# Patient Record
Sex: Male | Born: 1990 | Race: White | Hispanic: No | Smoking: Never smoker
Health system: Southern US, Community
[De-identification: ages and names within clinical notes are randomized; demographics above are authoritative.]

---
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
## 2010-06-20 NOTE — H&P (Signed)
NAME:  Logan Reeves, Logan Reeves NO.:  [id number]   MEDICAL RECORD NO.:  [id number]                  PATIENT TYPE:   LOCATION:                                       FACILITY:   PHYSICIAN:  Vickki Hearing, M.D.           DATE OF BIRTH:  1990/09/18   DATE OF ADMISSION:  DATE OF DISCHARGE:                                HISTORY & PHYSICAL   CHIEF COMPLAINT:  Mass in the right long finger.   HISTORY:  He is 20 years old.  He has noticed a knot in the right long  finger for a couple of months now.  It appears to be getting bigger.  He  wishes to have it removed because of pain.   PAST MEDICAL HISTORY:  He has a history of headaches and seasonal allergies.  He has had an adenoidectomy and tubes placed in his ears at age 42.  He takes  Clarinex.   FAMILY HISTORY:  He has a family history of heart disease and arthritis.  His family is followed by the [facility].   ALLERGIES:  He has no medical allergies.   PHYSICAL EXAMINATION:  VITAL SIGNS:  Weight 107, pulse 80, respiratory rate  20.  GENERAL:  He is well-developed, well-nourished.  Grooming and hygiene are  normal.  He has normal pulses and perfusion. No obvious lymph nodes.  EXTREMITIES:  He has a mass in the palmar aspect of the right long finger at  the MP joint crease.  It does not affect range of motion, but it is tender.  His finger is stable.  Strength is normal.  SKIN:  Intact.  He does have acne.  NEUROLOGIC:  Exam was normal.  He was awake and alert.  No depression or  anxiety.   IMPRESSION:  Ganglion cyst.  Recommend excision, and follow up with me in  the office after outpatient surgery.     ___________________________________________                                         Vickki Hearing, M.D.   SEH/MEDQ  D:  08/20/2003  T:  08/20/2003  Job:  [id number]
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
## 2010-07-29 ENCOUNTER — Emergency Department (HOSPITAL_COMMUNITY)
Admission: EM | Admit: 2010-07-29 | Discharge: 2010-07-29 | Disposition: A | Discharge status: Home / Self Care | Payer: Worker's Compensation | Attending: Emergency Medicine | Admitting: Emergency Medicine

## 2010-07-29 DIAGNOSIS — S51809A Unspecified open wound of unspecified forearm, initial encounter: Secondary | ICD-10-CM | POA: Insufficient documentation

## 2010-07-29 DIAGNOSIS — W260XXA Contact with knife, initial encounter: Secondary | ICD-10-CM | POA: Insufficient documentation

## 2010-07-29 DIAGNOSIS — W261XXA Contact with sword or dagger, initial encounter: Secondary | ICD-10-CM | POA: Insufficient documentation

## 2010-10-31 LAB — POCT HEMOGLOBIN-HEMACUE: Hemoglobin: 15.8

## 2013-10-29 ENCOUNTER — Emergency Department (HOSPITAL_COMMUNITY)
Admission: EM | Admit: 2013-10-29 | Discharge: 2013-10-29 | Disposition: A | Discharge status: Home / Self Care | Payer: BC Managed Care – PPO | Attending: Emergency Medicine | Admitting: Emergency Medicine

## 2013-10-29 ENCOUNTER — Emergency Department (HOSPITAL_COMMUNITY): Payer: BC Managed Care – PPO

## 2013-10-29 ENCOUNTER — Encounter (HOSPITAL_COMMUNITY): Payer: Self-pay | Admitting: Emergency Medicine

## 2013-10-29 DIAGNOSIS — H6120 Impacted cerumen, unspecified ear: Secondary | ICD-10-CM | POA: Diagnosis not present

## 2013-10-29 DIAGNOSIS — R6883 Chills (without fever): Secondary | ICD-10-CM | POA: Insufficient documentation

## 2013-10-29 DIAGNOSIS — Z79899 Other long term (current) drug therapy: Secondary | ICD-10-CM | POA: Diagnosis not present

## 2013-10-29 DIAGNOSIS — J029 Acute pharyngitis, unspecified: Secondary | ICD-10-CM | POA: Diagnosis not present

## 2013-10-29 DIAGNOSIS — R6889 Other general symptoms and signs: Secondary | ICD-10-CM

## 2013-10-29 LAB — RAPID STREP SCREEN (MED CTR MEBANE ONLY): STREPTOCOCCUS, GROUP A SCREEN (DIRECT): NEGATIVE

## 2013-10-29 MED ORDER — ACETAMINOPHEN 500 MG PO TABS: 500.0000 mg | ORAL_TABLET | Freq: Four times a day (QID) | ORAL | Status: AC | PRN

## 2013-10-29 MED ORDER — IBUPROFEN 400 MG PO TABS
800.0000 mg | ORAL_TABLET | Freq: Once | ORAL | Status: AC
  Administered 2013-10-29: 800 mg via ORAL
  Filled 2013-10-29: qty 2

## 2013-10-29 MED ORDER — IBUPROFEN 800 MG PO TABS: 800.0000 mg | ORAL_TABLET | Freq: Three times a day (TID) | ORAL | Status: AC

## 2013-10-29 MED ORDER — ACETAMINOPHEN 325 MG PO TABS
325.0000 mg | ORAL_TABLET | Freq: Once | ORAL | Status: AC
  Administered 2013-10-29: 325 mg via ORAL
  Filled 2013-10-29: qty 1

## 2013-10-29 MED ORDER — ALBUTEROL SULFATE HFA 108 (90 BASE) MCG/ACT IN AERS: 2.0000 | INHALATION_SPRAY | Freq: Four times a day (QID) | RESPIRATORY_TRACT | Status: AC | PRN

## 2013-10-29 MED ORDER — HYDROCODONE-HOMATROPINE 5-1.5 MG/5ML PO SYRP: 5.0000 mL | ORAL_SOLUTION | Freq: Four times a day (QID) | ORAL | Status: AC | PRN

## 2013-10-29 MED ORDER — GUAIFENESIN 100 MG/5ML PO SOLN
5.0000 mL | Freq: Once | ORAL | Status: AC
  Administered 2013-10-29: 100 mg via ORAL
  Filled 2013-10-29: qty 5

## 2013-10-29 NOTE — ED Provider Notes (Signed)
Medical screening examination/treatment/procedure(s) were performed by non-physician practitioner and as supervising physician I was immediately available for consultation/collaboration.   EKG Interpretation None        Courtney F Horton, MD 10/29/13 1943 

## 2013-10-29 NOTE — ED Provider Notes (Signed)
CSN: [id number]     Arrival date & time 10/29/13  1013 History  This chart was scribed for Francee Piccolo, PA-C working with Shon Baton, MD by Freida Busman, ED Scribe. This patient was seen in room TR09C/TR09C and the patient's care was started at 10:37 AM.    Chief Complaint  Patient presents with  . Sore Throat  . Chills     The history is provided by the patient. No language interpreter was used.    HPI Comments:  RORY XIANG is a 23 y.o. male who presents to the Emergency Department complaining of mild-moderate sore throat and  productive cough with dark "brown sputum" that started 4-5 days ago. He also reports associated chills that have been constant since waking this am.  He denies fever, ear pain, nausea and vomiting. No alleviating factors noted.   History reviewed. No pertinent past medical history. History reviewed. No pertinent past surgical history. No family history on file. History  Substance Use Topics  . Smoking status: Never Smoker   . Smokeless tobacco: Not on file  . Alcohol Use: No    Review of Systems  Constitutional: Positive for chills. Negative for fever.  HENT: Positive for sore throat.   Respiratory: Positive for cough.   All other systems reviewed and are negative.     Allergies  Review of patient's allergies indicates no known allergies.  Home Medications   Prior to Admission medications   Medication Sig Start Date End Date Taking? Authorizing Provider  acetaminophen (TYLENOL) 500 MG tablet Take 1 tablet (500 mg total) by mouth every 6 (six) hours as needed. 10/29/13   Kayson Tasker L Yohannes Waibel, PA-C  albuterol (PROVENTIL HFA;VENTOLIN HFA) 108 (90 BASE) MCG/ACT inhaler Inhale 2 puffs into the lungs every 6 (six) hours as needed for wheezing or shortness of breath. 10/29/13   Charonda Hefter L Aamari Strawderman, PA-C  HYDROcodone-homatropine (HYCODAN) 5-1.5 MG/5ML syrup Take 5 mLs by mouth every 6 (six) hours as needed for cough. 10/29/13    Devaunte Gasparini L Marlayna Bannister, PA-C  ibuprofen (ADVIL,MOTRIN) 800 MG tablet Take 1 tablet (800 mg total) by mouth 3 (three) times daily. 10/29/13   Kayti Poss L Swayzie Choate, PA-C   BP 116/79  Pulse 97  Temp(Src) 98.7 F (37.1 C) (Oral)  Resp 12  SpO2 100% Physical Exam  Nursing note and vitals reviewed. Constitutional: He is oriented to person, place, and time. He appears well-developed and well-nourished. No distress.  HENT:  Head: Normocephalic and atraumatic.  Right Ear: Hearing and external ear normal.  Left Ear: Hearing and external ear normal.  Nose: Nose normal.  Mouth/Throat: Uvula is midline. Mucous membranes are dry. Oropharyngeal exudate and posterior oropharyngeal erythema present. No posterior oropharyngeal edema or tonsillar abscesses.  Bilateral Cerumen impaction   Eyes: Conjunctivae are normal.  Neck: Normal range of motion. Neck supple.  Cardiovascular: Normal rate, regular rhythm and normal heart sounds.   Pulmonary/Chest: Effort normal and breath sounds normal. No respiratory distress.  Abdominal: Soft.  Musculoskeletal: Normal range of motion.  Lymphadenopathy:    He has cervical adenopathy.  Neurological: He is alert and oriented to person, place, and time.  Skin: Skin is warm and dry. He is not diaphoretic.  Psychiatric: He has a normal mood and affect.    ED Course  Procedures Medications  ibuprofen (ADVIL,MOTRIN) tablet 800 mg (800 mg Oral Given 10/29/13 1050)  guaiFENesin (ROBITUSSIN) 100 MG/5ML solution 100 mg (100 mg Oral Given 10/29/13 1103)  acetaminophen (TYLENOL) tablet 325 mg (325 mg  Oral Given 10/29/13 1310)    DIAGNOSTIC STUDIES: Oxygen Saturation is 100% on RA, normal by my interpretation.    COORDINATION OF CARE: 1:18 PM Discussed treatment plan with pt at bedside and pt agreed to plan.   Labs Review Labs Reviewed  RAPID STREP SCREEN  CULTURE, GROUP A STREP    Imaging Review Dg Chest 2 View  10/29/2013   CLINICAL DATA:  Cough, fever,  chills.  EXAM: CHEST  2 VIEW  COMPARISON:  08/10/2007  FINDINGS: The heart size and mediastinal contours are within normal limits. Both lungs are clear. The visualized skeletal structures are unremarkable.  IMPRESSION: No active cardiopulmonary disease.   Electronically Signed   By: Rosalie Gums M.D.   On: 10/29/2013 13:00     EKG Interpretation None      MDM   Final diagnoses:  Flu-like symptoms    Filed Vitals:   10/29/13 1312  BP: 116/79  Pulse: 97  Temp: 98.7 F (37.1 C)  Resp: 12   Patient with symptoms consistent with influenza.  Vitals are stable, low-grade fever.  No signs of dehydration, tolerating PO's.  Lungs are clear. Discussed the cost versus benefit of Tamiflu treatment with the patient.  The patient understands that symptoms are greater than the recommended 24-48 hour window of treatment.  Patient will be discharged with instructions to orally hydrate, rest, and use over-the-counter medications such as anti-inflammatories ibuprofen and Aleve for muscle aches and Tylenol for fever.  Patient will also be given a cough suppressant. Patient is stable at time of discharge              MDM Number of Diagnoses or Management Options Flu-like symptoms:     Jeannetta Ellis, PA-C 10/29/13 1318

## 2013-10-29 NOTE — Discharge Instructions (Signed)
Please follow up with your primary care physician in 1-2 days. If you do not have one please call the [facility] and wellness [facility] number listed above. Please see his medications as prescribed. Please be careful when taking Hycodan as it contains a narcotic and may make you drowsy. Disease or Hailer 2 puffs every 4-6 hours to help with the cough. Please read all discharge instructions and return precautions.   Influenza Influenza ("the flu") is a viral infection of the respiratory tract. It occurs more often in winter months because people spend more time in close contact with one another. Influenza can make you feel very sick. Influenza easily spreads from person to person (contagious). CAUSES  Influenza is caused by a virus that infects the respiratory tract. You can catch the virus by breathing in droplets from an infected person's cough or sneeze. You can also catch the virus by touching something that was recently contaminated with the virus and then touching your mouth, nose, or eyes. RISKS AND COMPLICATIONS You may be at risk for a more severe case of influenza if you smoke cigarettes, have diabetes, have chronic heart disease (such as heart failure) or lung disease (such as asthma), or if you have a weakened immune system. Elderly people and pregnant women are also at risk for more serious infections. The most common problem of influenza is a lung infection (pneumonia). Sometimes, this problem can require emergency medical care and may be life threatening. SIGNS AND SYMPTOMS  Symptoms typically last 4 to 10 days and may include:  Fever.  Chills.  Headache, body aches, and muscle aches.  Sore throat.  Chest discomfort and cough.  Poor appetite.  Weakness or feeling tired.  Dizziness.  Nausea or vomiting. DIAGNOSIS  Diagnosis of influenza is often made based on your history and a physical exam. A nose or throat swab test can be done to confirm the diagnosis. TREATMENT  In mild  cases, influenza goes away on its own. Treatment is directed at relieving symptoms. For more severe cases, your health care provider may prescribe antiviral medicines to shorten the sickness. Antibiotic medicines are not effective because the infection is caused by a virus, not by bacteria. HOME CARE INSTRUCTIONS  Take medicines only as directed by your health care provider.  Use a cool mist humidifier to make breathing easier.  Get plenty of rest until your temperature returns to normal. This usually takes 3 to 4 days.  Drink enough fluid to keep your urine clear or pale yellow.  Cover yourmouth and nosewhen coughing or sneezing,and wash your handswellto prevent thevirusfrom spreading.  Stay homefromwork orschool untilthe fever is gonefor at least 31full day. PREVENTION  An annual influenza vaccination (flu shot) is the best way to avoid getting influenza. An annual flu shot is now routinely recommended for all adults in the [place] SEEK MEDICAL CARE IF:  You experiencechest pain, yourcough worsens,or you producemore mucus.  Youhave nausea,vomiting, ordiarrhea.  Your fever returns or gets worse. SEEK IMMEDIATE MEDICAL CARE IF:  You havetrouble breathing, you become short of breath,or your skin ornails becomebluish.  You have severe painor stiffnessin the neck.  You develop a sudden headache, or pain in the face or ear.  You have nausea or vomiting that you cannot control. MAKE SURE YOU:   Understand these instructions.  Will watch your condition.  Will get help right away if you are not doing well or get worse. Document Released: 01/17/2000 Document Revised: 06/05/2013 Document Reviewed: 04/20/2011 [employer] Patient Information  2015 [employer]. This information is not intended to replace advice given to you by your health care provider. Make sure you discuss any questions you have with your health care provider.

## 2013-10-29 NOTE — ED Notes (Signed)
Pt presents to department for evaluation of sore throat, chills, and cough. Ongoing x5 days. 5/10 throat pain upon arrival, pt also states difficulty swallowing. Pt is alert and oriented x4. NAD.

## 2013-10-31 LAB — CULTURE, GROUP A STREP

## 2014-05-04 ENCOUNTER — Emergency Department (HOSPITAL_COMMUNITY)
Admission: EM | Admit: 2014-05-04 | Discharge: 2014-05-04 | Disposition: A | Discharge status: Home / Self Care | Payer: BLUE CROSS/BLUE SHIELD | Attending: Emergency Medicine | Admitting: Emergency Medicine

## 2014-05-04 ENCOUNTER — Encounter (HOSPITAL_COMMUNITY): Payer: Self-pay | Admitting: Emergency Medicine

## 2014-05-04 DIAGNOSIS — Z79899 Other long term (current) drug therapy: Secondary | ICD-10-CM | POA: Insufficient documentation

## 2014-05-04 DIAGNOSIS — R51 Headache: Secondary | ICD-10-CM

## 2014-05-04 DIAGNOSIS — R519 Headache, unspecified: Secondary | ICD-10-CM

## 2014-05-04 DIAGNOSIS — Z791 Long term (current) use of non-steroidal anti-inflammatories (NSAID): Secondary | ICD-10-CM | POA: Diagnosis not present

## 2014-05-04 DIAGNOSIS — M791 Myalgia: Secondary | ICD-10-CM | POA: Diagnosis not present

## 2014-05-04 DIAGNOSIS — J209 Acute bronchitis, unspecified: Secondary | ICD-10-CM | POA: Diagnosis not present

## 2014-05-04 DIAGNOSIS — J40 Bronchitis, not specified as acute or chronic: Secondary | ICD-10-CM

## 2014-05-04 MED ORDER — PSEUDOEPHEDRINE HCL 60 MG PO TABS: 60.0000 mg | ORAL_TABLET | Freq: Three times a day (TID) | ORAL | Status: AC | PRN

## 2014-05-04 MED ORDER — ALBUTEROL SULFATE HFA 108 (90 BASE) MCG/ACT IN AERS
2.0000 | INHALATION_SPRAY | RESPIRATORY_TRACT | Status: DC | PRN
  Administered 2014-05-04: 2 via RESPIRATORY_TRACT
  Filled 2014-05-04: qty 6.7

## 2014-05-04 MED ORDER — AZITHROMYCIN 250 MG PO TABS: ORAL_TABLET | ORAL | Status: AC

## 2014-05-04 NOTE — ED Provider Notes (Signed)
CSN: [id number]     Arrival date & time 05/04/14  0945 History   First MD Initiated Contact with Patient 05/04/14 [phone]     Chief Complaint  Patient presents with  . Facial Pain     (Consider location/radiation/quality/duration/timing/severity/associated sxs/prior Treatment) Patient is a 24 y.o. male presenting with URI. The history is provided by the patient.  URI Presenting symptoms: congestion, cough and facial pain   Presenting symptoms: no fever and no sore throat   Severity:  Moderate Onset quality:  Gradual Duration:  1 week Timing:  Constant Progression:  Worsening Chronicity:  New Relieved by:  Nothing Worsened by:  Nothing tried Ineffective treatments:  OTC medications Associated symptoms: headaches, myalgias, sinus pain, sneezing and wheezing    Logan Reeves is a 24 y.o. male who presents to the ED with cough, sinus congestion and sinus headache that started a week ago. The cough is productive with yellow-green sputum.   History reviewed. No pertinent past medical history. History reviewed. No pertinent past surgical history. No family history on file. History  Substance Use Topics  . Smoking status: Never Smoker   . Smokeless tobacco: Not on file  . Alcohol Use: No    Review of Systems  Constitutional: Negative for fever and chills.  HENT: Positive for congestion and sneezing. Negative for dental problem and sore throat.   Eyes: Negative for pain and visual disturbance.  Respiratory: Positive for cough and wheezing.   Gastrointestinal: Negative for nausea, vomiting and abdominal pain.  Genitourinary: Negative for dysuria, urgency and frequency.  Musculoskeletal: Positive for myalgias.  Skin: Negative for rash.  Neurological: Positive for headaches.  Psychiatric/Behavioral: Negative for confusion. The patient is not nervous/anxious.       Allergies  Review of patient's allergies indicates no known allergies.  Home Medications   Prior to Admission  medications   Medication Sig Start Date End Date Taking? Authorizing Provider  acetaminophen (TYLENOL) 500 MG tablet Take 1 tablet (500 mg total) by mouth every 6 (six) hours as needed. 10/29/13   Jennifer Piepenbrink, PA-C  albuterol (PROVENTIL HFA;VENTOLIN HFA) 108 (90 BASE) MCG/ACT inhaler Inhale 2 puffs into the lungs every 6 (six) hours as needed for wheezing or shortness of breath. 10/29/13   Jennifer Piepenbrink, PA-C  azithromycin (ZITHROMAX Z-PAK) 250 MG tablet Take 2 tablets now and then one tablet daily until finished. 05/04/14   Hope Orlene OchM Neese, NP  HYDROcodone-homatropine (HYCODAN) 5-1.5 MG/5ML syrup Take 5 mLs by mouth every 6 (six) hours as needed for cough. 10/29/13   Jennifer Piepenbrink, PA-C  ibuprofen (ADVIL,MOTRIN) 800 MG tablet Take 1 tablet (800 mg total) by mouth 3 (three) times daily. 10/29/13   Jennifer Piepenbrink, PA-C  pseudoephedrine (SUDAFED) 60 MG tablet Take 1 tablet (60 mg total) by mouth every 8 (eight) hours as needed for congestion. 05/04/14   Hope Orlene OchM Neese, NP   BP 139/85 mmHg  Pulse 82  Temp(Src) 98.5 F (36.9 C)  Resp 18  Ht 5\' 10"  (1.778 m)  Wt 135 lb (61.236 kg)  BMI 19.37 kg/m2  SpO2 98% Physical Exam  Constitutional: He is oriented to person, place, and time. He appears well-developed and well-nourished.  HENT:  Head: Normocephalic.  Right Ear: Tympanic membrane normal.  Left Ear: Tympanic membrane normal.  Nose: Right sinus exhibits maxillary sinus tenderness and frontal sinus tenderness.  Mouth/Throat: Uvula is midline, oropharynx is clear and moist and mucous membranes are normal.  Eyes: Conjunctivae and EOM are normal.  Neck: Normal range  of motion. Neck supple.  Cardiovascular: Normal rate and regular rhythm.   Pulmonary/Chest: Effort normal. No respiratory distress. Wheezes: occasional.  Abdominal: Soft. There is no tenderness.  Musculoskeletal: Normal range of motion.  Lymphadenopathy:    He has no cervical adenopathy.  Neurological: He is  alert and oriented to person, place, and time. No cranial nerve deficit.  Skin: Skin is warm and dry.  Psychiatric: He has a normal mood and affect. His behavior is normal.  Nursing note and vitals reviewed.   ED Course  Procedures (including critical care time) Albuterol inhaler with instructions per respiratory.  Labs Review Labs Reviewed - No data to display   MDM  24 y.o. male with sinus congestion, facial pain and cough x 1 week. Stable for discharge without meningeal signs, he does not appear toxic, afebrile. Treatment with albuterol inhaler, sudafed, Robitussin and Z-Pak.  Discussed with the patient clinical findings and plan of care and all questioned fully answered. He will return if any problems arise.  Final diagnoses:  Bronchitis  Sinus headache      Janne Napoleon, NP 05/04/14 1026  Donnetta Hutching, MD 05/04/14 1427

## 2014-05-04 NOTE — Discharge Instructions (Signed)
Take the medication as directed. You may use Robitussin Cough medication in addition. Use the inhaler as needed. Return for worsening symptoms.

## 2014-05-04 NOTE — ED Notes (Signed)
Pt c/o facial pain and congestion x 1 week. Reports productive cough-green sputum.

## 2014-05-04 NOTE — ED Notes (Signed)
Pt seen and evaluated by EDNP for initial assessment. 

## 2016-02-13 IMAGING — CR DG CHEST 2V
2 series · 2 of 2 positions shown · non-contrast
Comparison: 08/10/2007

CLINICAL DATA: Cough, fever, chills.

EXAM:
CHEST  2 VIEW

[w chest pa]
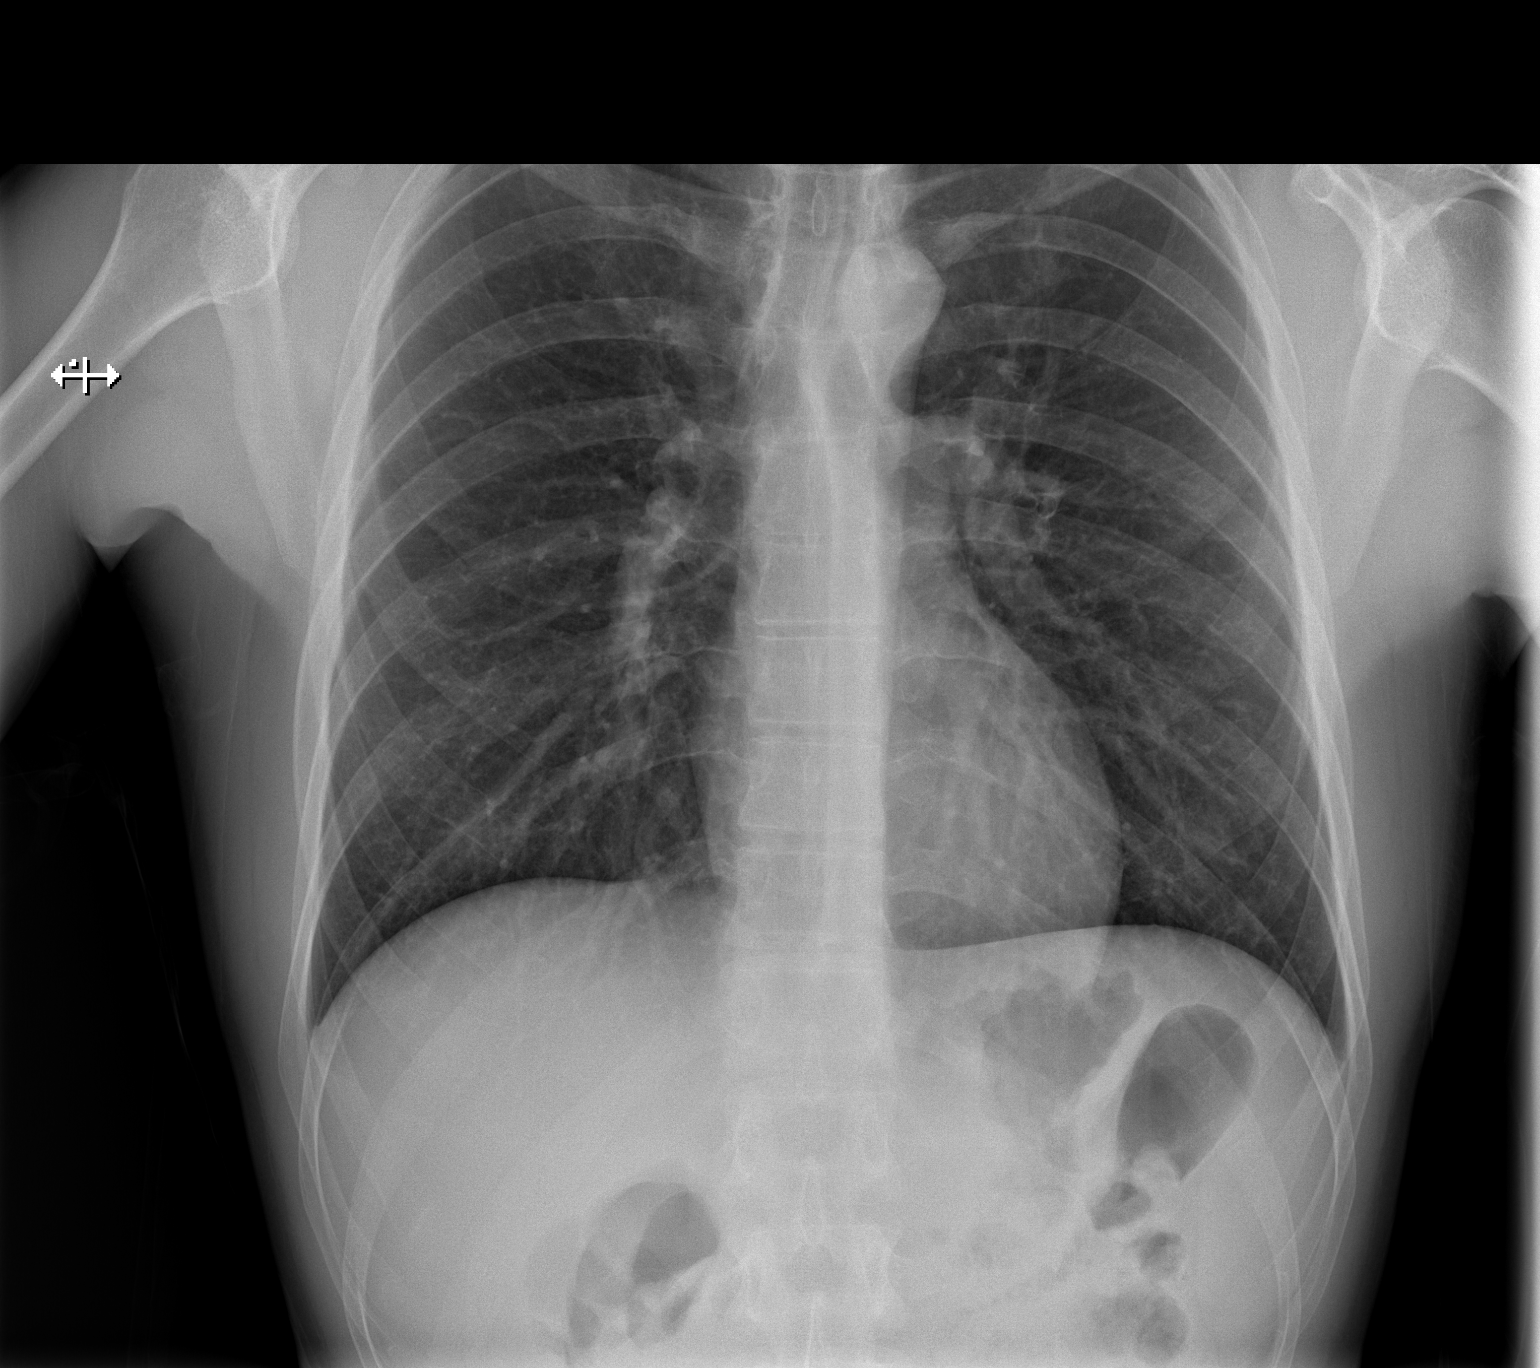

[w chest lat]
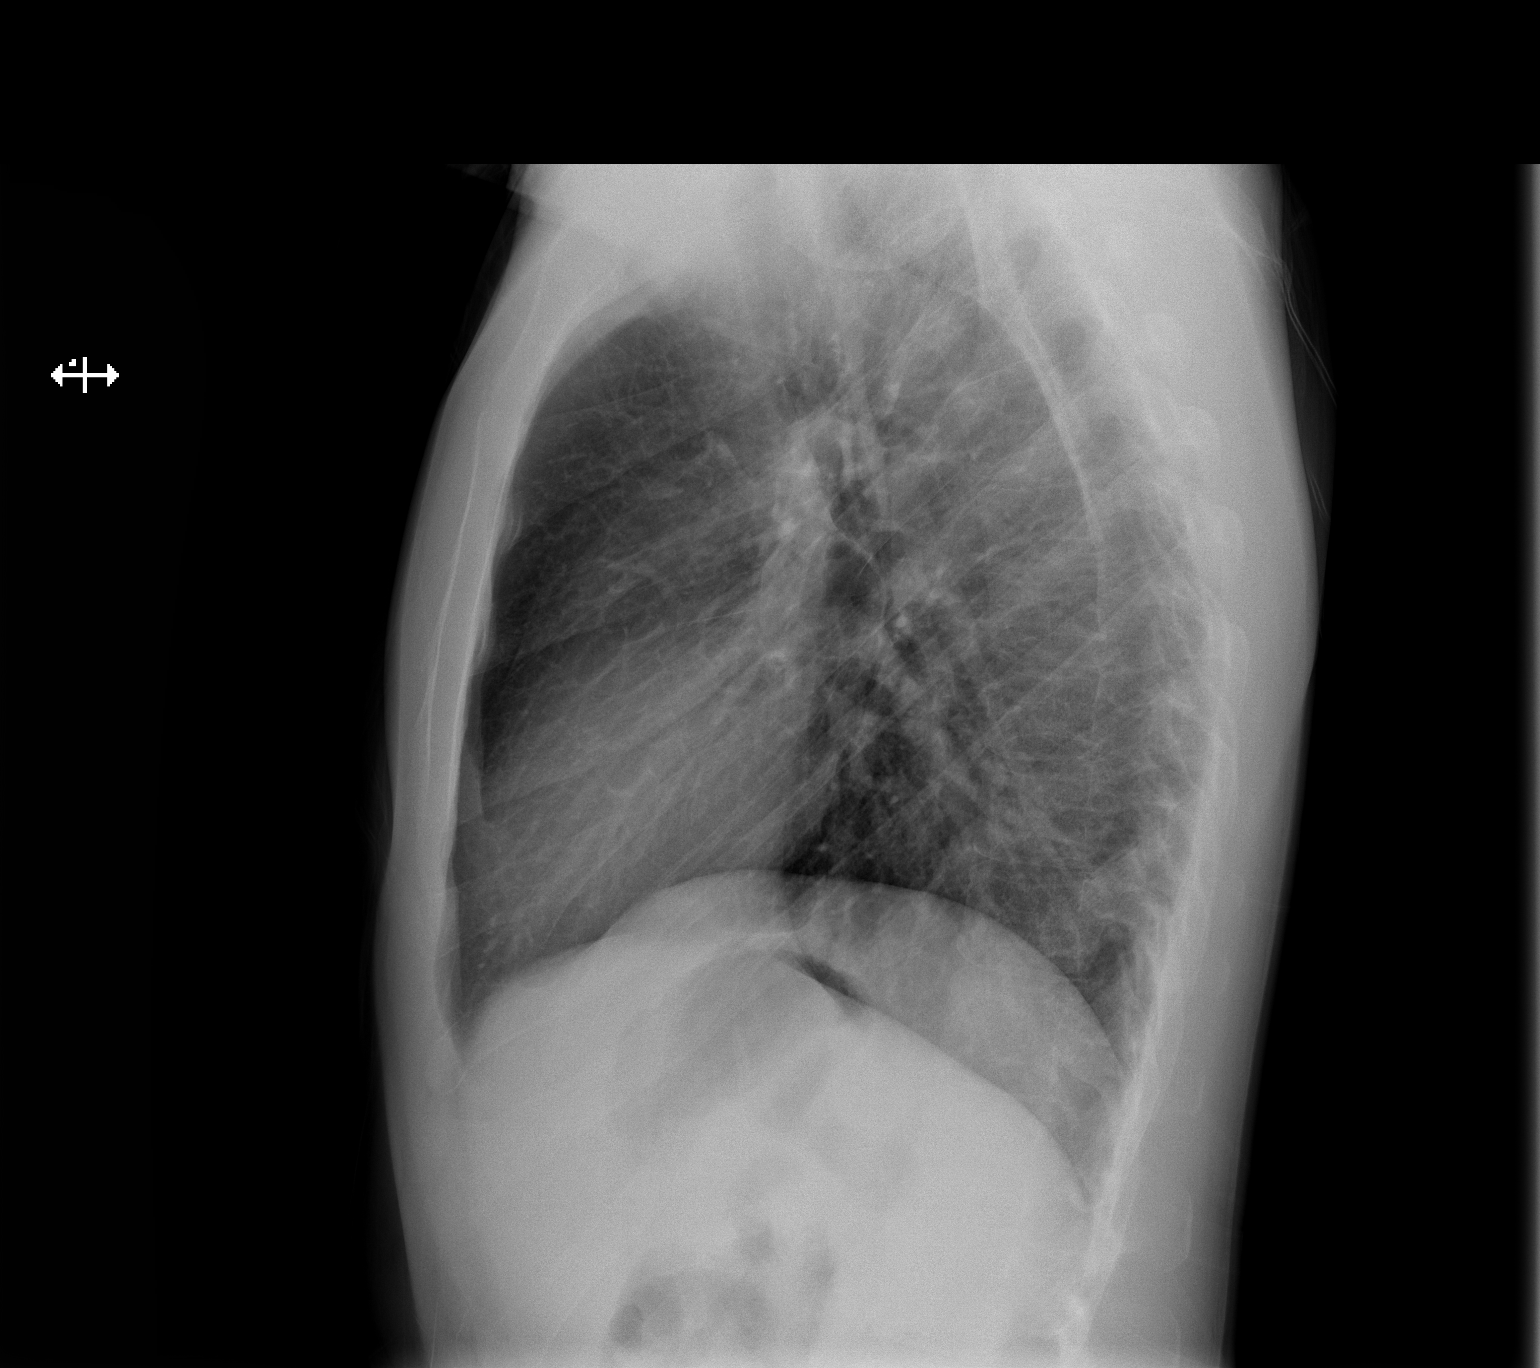

[2 of 2 positions shown; findings below may reference images not displayed]

FINDINGS: The heart size and mediastinal contours are within normal limits.
Both lungs are clear. The visualized skeletal structures are
unremarkable.
IMPRESSION: No active cardiopulmonary disease.
